# Patient Record
Sex: Male | Born: 1980 | Race: White | Hispanic: No | Marital: Single | Smoking: Current every day smoker
Health system: Southern US, Community
[De-identification: ages and names within clinical notes are randomized; demographics above are authoritative.]

---

## 2015-04-29 ENCOUNTER — Encounter (HOSPITAL_COMMUNITY): Payer: Self-pay | Admitting: Emergency Medicine

## 2015-04-29 ENCOUNTER — Emergency Department (HOSPITAL_COMMUNITY): Payer: Self-pay

## 2015-04-29 ENCOUNTER — Emergency Department (HOSPITAL_COMMUNITY)
Admission: EM | Admit: 2015-04-29 | Discharge: 2015-04-29 | Disposition: A | Discharge status: Home / Self Care | Payer: Self-pay | Attending: Emergency Medicine | Admitting: Emergency Medicine

## 2015-04-29 DIAGNOSIS — Z720 Tobacco use: Secondary | ICD-10-CM | POA: Insufficient documentation

## 2015-04-29 DIAGNOSIS — J069 Acute upper respiratory infection, unspecified: Secondary | ICD-10-CM | POA: Insufficient documentation

## 2015-04-29 DIAGNOSIS — L299 Pruritus, unspecified: Secondary | ICD-10-CM | POA: Insufficient documentation

## 2015-04-29 DIAGNOSIS — J309 Allergic rhinitis, unspecified: Secondary | ICD-10-CM | POA: Insufficient documentation

## 2015-04-29 MED ORDER — IPRATROPIUM BROMIDE 0.02 % IN SOLN
0.5000 mg | Freq: Once | RESPIRATORY_TRACT | Status: AC
  Administered 2015-04-29: 0.5 mg via RESPIRATORY_TRACT
  Filled 2015-04-29: qty 2.5

## 2015-04-29 MED ORDER — FLUTICASONE PROPIONATE 50 MCG/ACT NA SUSP: 1.0000 | Freq: Every day | NASAL | Status: AC

## 2015-04-29 MED ORDER — ALBUTEROL SULFATE (2.5 MG/3ML) 0.083% IN NEBU
5.0000 mg | INHALATION_SOLUTION | Freq: Once | RESPIRATORY_TRACT | Status: AC
  Administered 2015-04-29: 5 mg via RESPIRATORY_TRACT
  Filled 2015-04-29: qty 6

## 2015-04-29 MED ORDER — CETIRIZINE HCL 10 MG PO TABS: 10.0000 mg | ORAL_TABLET | Freq: Every day | ORAL | Status: AC

## 2015-04-29 MED ORDER — BENZONATATE 100 MG PO CAPS: 100.0000 mg | ORAL_CAPSULE | Freq: Three times a day (TID) | ORAL | Status: AC

## 2015-04-29 MED ORDER — NAPROXEN 250 MG PO TABS: 250.0000 mg | ORAL_TABLET | Freq: Two times a day (BID) | ORAL | Status: AC

## 2015-04-29 MED ORDER — ALBUTEROL SULFATE HFA 108 (90 BASE) MCG/ACT IN AERS
2.0000 | INHALATION_SPRAY | Freq: Once | RESPIRATORY_TRACT | Status: AC
  Administered 2015-04-29: 2 via RESPIRATORY_TRACT
  Filled 2015-04-29: qty 6.7

## 2015-04-29 NOTE — ED Notes (Signed)
Pt reports cough and congestion x 4 days , also reports body aches. Pt denies chest pain yet discomfort when coughing.

## 2015-04-29 NOTE — ED Provider Notes (Signed)
CSN: [id number]     Arrival date & time 04/29/15  1616 History  By signing my name below, I, Freida Busmaniana Omoyeni, attest that this documentation has been prepared under the direction and in the presence of non-physician practitioner, Everlene FarrierWilliam Sahasra Belue, PA-C. Electronically Signed: Freida Busmaniana Omoyeni, Scribe. 04/29/2015. 5:30 PM.  Chief Complaint  Patient presents with  . URI   The history is provided by the patient. No language interpreter was used.   HPI Comments:  Christian Schmidt is a 34 y.o. male who presents to the Emergency Department complaining of "deep" cough for ~ 4 days. He reports associated nasal congestion, myalgias, eye itching, wheezing, sneezing, runny nose, and postnasal drip. He has taken mucinex without relief. Pt denies fever, shortness of breath, chest pain, ear pain, abdominal pain, nausea, rash and vomiting. Pt smokes daily; denies h/o asthma, COPD, and emphysema. No alleviating factors noted.  History reviewed. No pertinent past medical history. History reviewed. No pertinent past surgical history. No family history on file. Social History  Substance Use Topics  . Smoking status: Current Every Day Smoker  . Smokeless tobacco: None  . Alcohol Use: Yes    Review of Systems  Constitutional: Negative for fever and chills.  HENT: Positive for congestion, postnasal drip, rhinorrhea and sneezing. Negative for ear pain, sore throat and trouble swallowing.   Eyes: Positive for itching. Negative for visual disturbance.  Respiratory: Positive for cough and wheezing. Negative for shortness of breath.   Cardiovascular: Negative for chest pain.  Gastrointestinal: Negative for nausea, vomiting and abdominal pain.  Genitourinary: Negative for dysuria.  Musculoskeletal: Negative for myalgias, arthralgias, neck pain and neck stiffness.  Skin: Negative for rash.  Neurological: Negative for light-headedness and headaches.   Allergies  Review of patient's allergies indicates not on  file.  Home Medications   Prior to Admission medications   Medication Sig Start Date End Date Taking? Authorizing Provider  benzonatate (TESSALON) 100 MG capsule Take 1 capsule (100 mg total) by mouth every 8 (eight) hours. 04/29/15   Everlene FarrierWilliam Jordell Outten, PA-C  cetirizine (ZYRTEC ALLERGY) 10 MG tablet Take 1 tablet (10 mg total) by mouth daily. 04/29/15   Everlene FarrierWilliam India Jolin, PA-C  fluticasone (FLONASE) 50 MCG/ACT nasal spray Place 1 spray into both nostrils daily. 04/29/15   Everlene FarrierWilliam Cydney Alvarenga, PA-C  naproxen (NAPROSYN) 250 MG tablet Take 1 tablet (250 mg total) by mouth 2 (two) times daily with a meal. 04/29/15   Everlene FarrierWilliam Cardarius Senat, PA-C   BP 130/85 mmHg  Pulse 86  Temp(Src) 98.1 F (36.7 C) (Oral)  Resp 14  SpO2 98% Physical Exam  Constitutional: He appears well-developed and well-nourished. No distress.  Nontoxic appearing.  HENT:  Head: Normocephalic and atraumatic.  Right Ear: Tympanic membrane and external ear normal.  Left Ear: External ear normal.  Mouth/Throat: Oropharynx is clear and moist. No oropharyngeal exudate.  Boggy nasal turbinates bilaterally No tonsillar hypertrophy or exudates.  Bilateral tympanic membranes are pearly-gray without erythema or loss of landmarks.   Eyes: Conjunctivae are normal. Pupils are equal, round, and reactive to light. Right eye exhibits no discharge. Left eye exhibits no discharge.  Neck: Normal range of motion. Neck supple.  Cardiovascular: Normal rate, regular rhythm, normal heart sounds and intact distal pulses.   Pulmonary/Chest: Effort normal. No respiratory distress. He has wheezes. He has no rales.  Scattered wheezes bilaterally. No increased work of breathing. Patient speaking in complete sentences. Symmetric chest expansion bilaterally.  Abdominal: Soft. There is no tenderness. There is no guarding.  Musculoskeletal: He  exhibits no edema or tenderness.  Lymphadenopathy:    He has no cervical adenopathy.  Neurological: He is alert. Coordination  normal.  Skin: Skin is warm and dry. No rash noted. He is not diaphoretic. No erythema. No pallor.  Psychiatric: He has a normal mood and affect. His behavior is normal.  Nursing note and vitals reviewed.   ED Course  Procedures   DIAGNOSTIC STUDIES:  Oxygen Saturation is 98% on RA, normal by my interpretation.    COORDINATION OF CARE:  5:25 PM Will order breathing treatment and CXR.  Discussed treatment plan with pt at bedside and pt agreed to plan.  Labs Review Labs Reviewed - No data to display  Imaging Review Dg Chest 2 View  04/29/2015  CLINICAL DATA:  Productive cough, wheezing. EXAM: CHEST  2 VIEW COMPARISON:  None. FINDINGS: The heart size and mediastinal contours are within normal limits. Both lungs are clear. No pneumothorax or pleural effusion is noted. Pectus excavatum deformity of the sternum is noted. IMPRESSION: No active cardiopulmonary disease. Electronically Signed   By: Lupita Raider, M.D.   On: 04/29/2015 17:42   I have personally reviewed and evaluated these images as part of my medical decision-making.   EKG Interpretation None      Filed Vitals:   04/29/15 1622  BP: 130/85  Pulse: 86  Temp: 98.1 F (36.7 C)  TempSrc: Oral  Resp: 14  SpO2: 98%     MDM   Meds given in ED:  Medications  albuterol (PROVENTIL) (2.5 MG/3ML) 0.083% nebulizer solution 5 mg (5 mg Nebulization Given 04/29/15 1802)  ipratropium (ATROVENT) nebulizer solution 0.5 mg (0.5 mg Nebulization Given 04/29/15 1802)  albuterol (PROVENTIL HFA;VENTOLIN HFA) 108 (90 BASE) MCG/ACT inhaler 2 puff (2 puffs Inhalation Given 04/29/15 1838)    New Prescriptions   BENZONATATE (TESSALON) 100 MG CAPSULE    Take 1 capsule (100 mg total) by mouth every 8 (eight) hours.   CETIRIZINE (ZYRTEC ALLERGY) 10 MG TABLET    Take 1 tablet (10 mg total) by mouth daily.   FLUTICASONE (FLONASE) 50 MCG/ACT NASAL SPRAY    Place 1 spray into both nostrils daily.   NAPROXEN (NAPROSYN) 250 MG TABLET    Take  1 tablet (250 mg total) by mouth 2 (two) times daily with a meal.    Final diagnoses:  URI (upper respiratory infection)  Allergic rhinitis, unspecified allergic rhinitis type   This  is a 34 y.o. male who presents to the Emergency Department complaining of "deep" cough for ~ 4 days. He reports associated nasal congestion, myalgias, eye itching, wheezing, sneezing, runny nose, and postnasal drip. He denies fever or shortness of breath. On exam the patient is afebrile and nontoxic-appearing. He has scattered wheezes noted bilaterally. He denies history of asthma. No increased work of breathing. Alert and saturation 98% there. Will provide with breathing treatment and check chest x-ray. Chest x-ray is unremarkable. Repeat lung exam after breathing treatment is much improved. Patient reports improvement after breathing treatment. Will discharge with albuterol inhaler, prescriptions for Zyrtec, Tessalon Perles, Flonase and Naprosyn. Patient has evidence of a viral URI. I advised the patient to follow-up with their primary care provider this week. I advised the patient to return to the emergency department with new or worsening symptoms or new concerns. The patient verbalized understanding and agreement with plan.    I personally performed the services described in this documentation, which was scribed in my presence. The recorded information has been reviewed and  is accurate.       Everlene Farrier, PA-C 04/29/15 1841  Bethann Berkshire, MD 05/03/15 [phone]

## 2015-04-29 NOTE — Discharge Instructions (Signed)
Upper Respiratory Infection, Adult Most upper respiratory infections (URIs) are a viral infection of the air passages leading to the lungs. A URI affects the nose, throat, and upper air passages. The most common type of URI is nasopharyngitis and is typically referred to as "the common cold." URIs run their course and usually go away on their own. Most of the time, a URI does not require medical attention, but sometimes a bacterial infection in the upper airways can follow a viral infection. This is called a secondary infection. Sinus and middle ear infections are common types of secondary upper respiratory infections. Bacterial pneumonia can also complicate a URI. A URI can worsen asthma and chronic obstructive pulmonary disease (COPD). Sometimes, these complications can require emergency medical care and may be life threatening.  CAUSES Almost all URIs are caused by viruses. A virus is a type of germ and can spread from one person to another.  RISKS FACTORS You may be at risk for a URI if:   You smoke.   You have chronic heart or lung disease.  You have a weakened defense (immune) system.   You are very young or very old.   You have nasal allergies or asthma.  You work in crowded or poorly ventilated areas.  You work in health care facilities or schools. SIGNS AND SYMPTOMS  Symptoms typically develop 2-3 days after you come in contact with a cold virus. Most viral URIs last 7-10 days. However, viral URIs from the influenza virus (flu virus) can last 14-18 days and are typically more severe. Symptoms may include:   Runny or stuffy (congested) nose.   Sneezing.   Cough.   Sore throat.   Headache.   Fatigue.   Fever.   Loss of appetite.   Pain in your forehead, behind your eyes, and over your cheekbones (sinus pain).  Muscle aches.  DIAGNOSIS  Your health care provider may diagnose a URI by:  Physical exam.  Tests to check that your symptoms are not due to  another condition such as:  Strep throat.  Sinusitis.  Pneumonia.  Asthma. TREATMENT  A URI goes away on its own with time. It cannot be cured with medicines, but medicines may be prescribed or recommended to relieve symptoms. Medicines may help:  Reduce your fever.  Reduce your cough.  Relieve nasal congestion. HOME CARE INSTRUCTIONS   Take medicines only as directed by your health care provider.   Gargle warm saltwater or take cough drops to comfort your throat as directed by your health care provider.  Use a warm mist humidifier or inhale steam from a shower to increase air moisture. This may make it easier to breathe.  Drink enough fluid to keep your urine clear or pale yellow.   Eat soups and other clear broths and maintain good nutrition.   Rest as needed.   Return to work when your temperature has returned to normal or as your health care provider advises. You may need to stay home longer to avoid infecting others. You can also use a face mask and careful hand washing to prevent spread of the virus.  Increase the usage of your inhaler if you have asthma.   Do not use any tobacco products, including cigarettes, chewing tobacco, or electronic cigarettes. If you need help quitting, ask your health care provider. PREVENTION  The best way to protect yourself from getting a cold is to practice good hygiene.   Avoid oral or hand contact with people with cold  symptoms.   Wash your hands often if contact occurs.  There is no clear evidence that vitamin C, vitamin E, echinacea, or exercise reduces the chance of developing a cold. However, it is always recommended to get plenty of rest, exercise, and practice good nutrition.  SEEK MEDICAL CARE IF:   You are getting worse rather than better.   Your symptoms are not controlled by medicine.   You have chills.  You have worsening shortness of breath.  You have brown or red mucus.  You have yellow or brown nasal  discharge.  You have pain in your face, especially when you bend forward.  You have a fever.  You have swollen neck glands.  You have pain while swallowing.  You have white areas in the back of your throat. SEEK IMMEDIATE MEDICAL CARE IF:   You have severe or persistent:  Headache.  Ear pain.  Sinus pain.  Chest pain.  You have chronic lung disease and any of the following:  Wheezing.  Prolonged cough.  Coughing up blood.  A change in your usual mucus.  You have a stiff neck.  You have changes in your:  Vision.  Hearing.  Thinking.  Mood. MAKE SURE YOU:   Understand these instructions.  Will watch your condition.  Will get help right away if you are not doing well or get worse.   This information is not intended to replace advice given to you by your health care provider. Make sure you discuss any questions you have with your health care provider.   Document Released: 12/13/2000 Document Revised: 11/03/2014 Document Reviewed: 09/24/2013 Elsevier Interactive Patient Education 2016 [employer].  Allergic Rhinitis Allergic rhinitis is when the mucous membranes in the nose respond to allergens. Allergens are particles in the air that cause your body to have an allergic reaction. This causes you to release allergic antibodies. Through a chain of events, these eventually cause you to release histamine into the blood stream. Although meant to protect the body, it is this release of histamine that causes your discomfort, such as frequent sneezing, congestion, and an itchy, runny nose.  CAUSES Seasonal allergic rhinitis (hay fever) is caused by pollen allergens that may come from grasses, trees, and weeds. Year-round allergic rhinitis (perennial allergic rhinitis) is caused by allergens such as house dust mites, pet dander, and mold spores. SYMPTOMS  Nasal stuffiness (congestion).  Itchy, runny nose with sneezing and tearing of the eyes. DIAGNOSIS Your  health care provider can help you determine the allergen or allergens that trigger your symptoms. If you and your health care provider are unable to determine the allergen, skin or blood testing may be used. Your health care provider will diagnose your condition after taking your health history and performing a physical exam. Your health care provider may assess you for other related conditions, such as asthma, pink eye, or an ear infection. TREATMENT Allergic rhinitis does not have a cure, but it can be controlled by:  Medicines that block allergy symptoms. These may include allergy shots, nasal sprays, and oral antihistamines.  Avoiding the allergen. Hay fever may often be treated with antihistamines in pill or nasal spray forms. Antihistamines block the effects of histamine. There are over-the-counter medicines that may help with nasal congestion and swelling around the eyes. Check with your health care provider before taking or giving this medicine. If avoiding the allergen or the medicine prescribed do not work, there are many new medicines your health care provider can prescribe. Stronger  medicine may be used if initial measures are ineffective. Desensitizing injections can be used if medicine and avoidance does not work. Desensitization is when a patient is given ongoing shots until the body becomes less sensitive to the allergen. Make sure you follow up with your health care provider if problems continue. HOME CARE INSTRUCTIONS It is not possible to completely avoid allergens, but you can reduce your symptoms by taking steps to limit your exposure to them. It helps to know exactly what you are allergic to so that you can avoid your specific triggers. SEEK MEDICAL CARE IF:  You have a fever.  You develop a cough that does not stop easily (persistent).  You have shortness of breath.  You start wheezing.  Symptoms interfere with normal daily activities.   This information is not intended  to replace advice given to you by your health care provider. Make sure you discuss any questions you have with your health care provider.   Document Released: 03/14/2001 Document Revised: 07/10/2014 Document Reviewed: 02/24/2013 Elsevier Interactive Patient Education 2016 [employer] Bronchospasm, Adult A bronchospasm is when the tubes that carry air in and out of your lungs (airways) spasm or tighten. During a bronchospasm it is hard to breathe. This is because the airways get smaller. A bronchospasm can be triggered by:  Allergies. These may be to animals, pollen, food, or mold.  Infection. This is a common cause of bronchospasm.  Exercise.  Irritants. These include pollution, cigarette smoke, strong odors, aerosol sprays, and paint fumes.  Weather changes.  Stress.  Being emotional. HOME CARE   Always have a plan for getting help. Know when to call your doctor and local emergency services (911 in the [place] Know where you can get emergency care.  Only take medicines as told by your doctor.  If you were prescribed an inhaler or nebulizer machine, ask your doctor how to use it correctly. Always use a spacer with your inhaler if you were given one.  Stay calm during an attack. Try to relax and breathe more slowly.  Control your home environment:  Change your heating and air conditioning filter at least once a month.  Limit your use of fireplaces and wood stoves.  Do not  smoke. Do not  allow smoking in your home.  Avoid perfumes and fragrances.  Get rid of pests (such as roaches and mice) and their droppings.  Throw away plants if you see mold on them.  Keep your house clean and dust free.  Replace carpet with wood, tile, or vinyl flooring. Carpet can trap dander and dust.  Use allergy-proof pillows, mattress covers, and box spring covers.  Wash bed sheets and blankets every week in hot water. Dry them in a dryer.  Use blankets that are made of polyester or  cotton.  Wash hands frequently. GET HELP IF:  You have muscle aches.  You have chest pain.  The thick spit you spit or cough up (sputum) changes from clear or white to yellow, green, gray, or bloody.  The thick spit you spit or cough up gets thicker.  There are problems that may be related to the medicine you are given such as:  A rash.  Itching.  Swelling.  Trouble breathing. GET HELP RIGHT AWAY IF:  You feel you cannot breathe or catch your breath.  You cannot stop coughing.  Your treatment is not helping you breathe better.  You have very bad chest pain. MAKE SURE YOU:   Understand these instructions.  Will watch your condition.  Will get help right away if you are not doing well or get worse.   This information is not intended to replace advice given to you by your health care provider. Make sure you discuss any questions you have with your health care provider.   Document Released: 04/16/2009 Document Revised: 07/10/2014 Document Reviewed: 12/10/2012 Elsevier Interactive Patient Education [employer]

## 2017-01-25 IMAGING — CR DG CHEST 2V
2 series · 2 of 2 positions shown · non-contrast
Comparison: None.

CLINICAL DATA: Productive cough, wheezing.

EXAM:
CHEST  2 VIEW

[w chest pa]
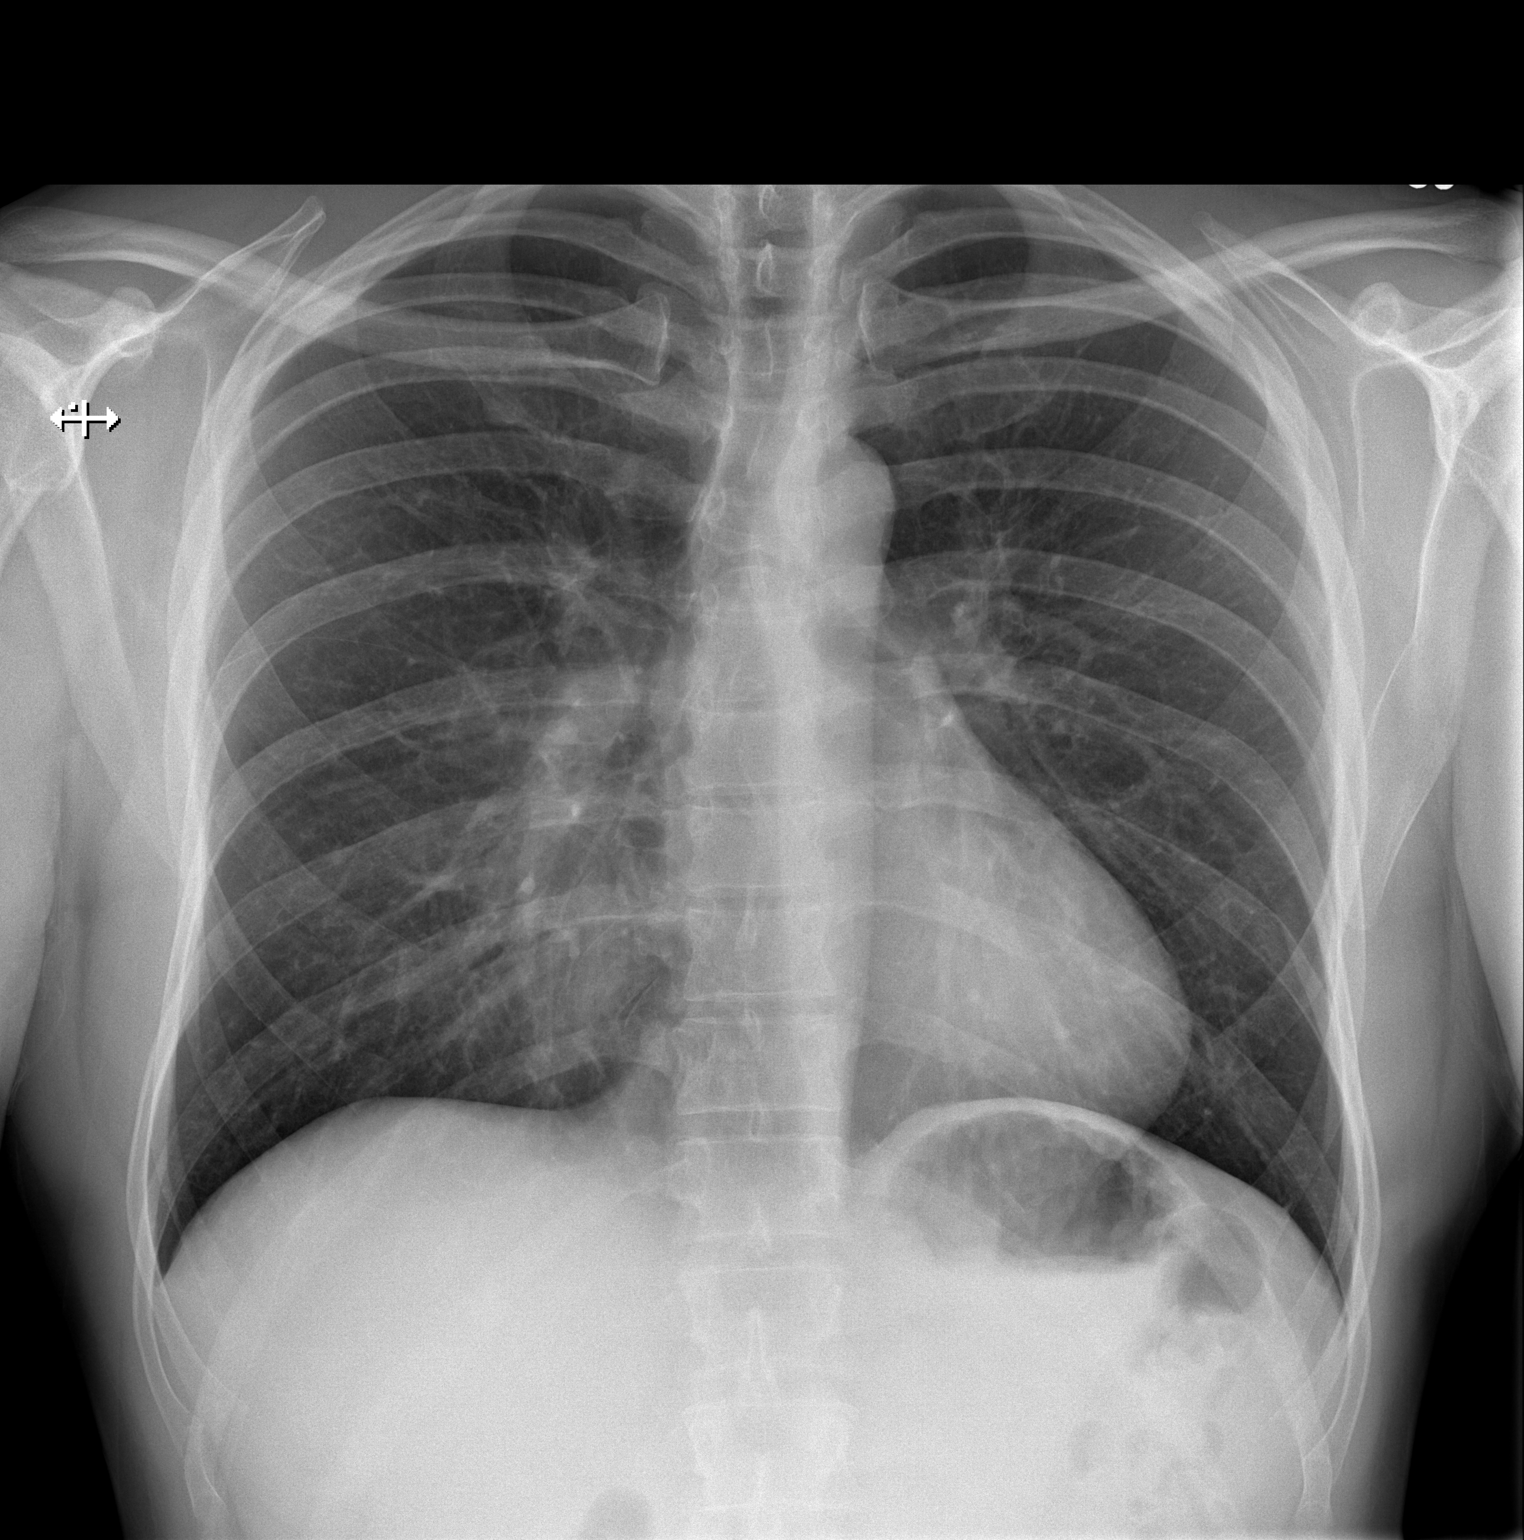

[w chest lat]
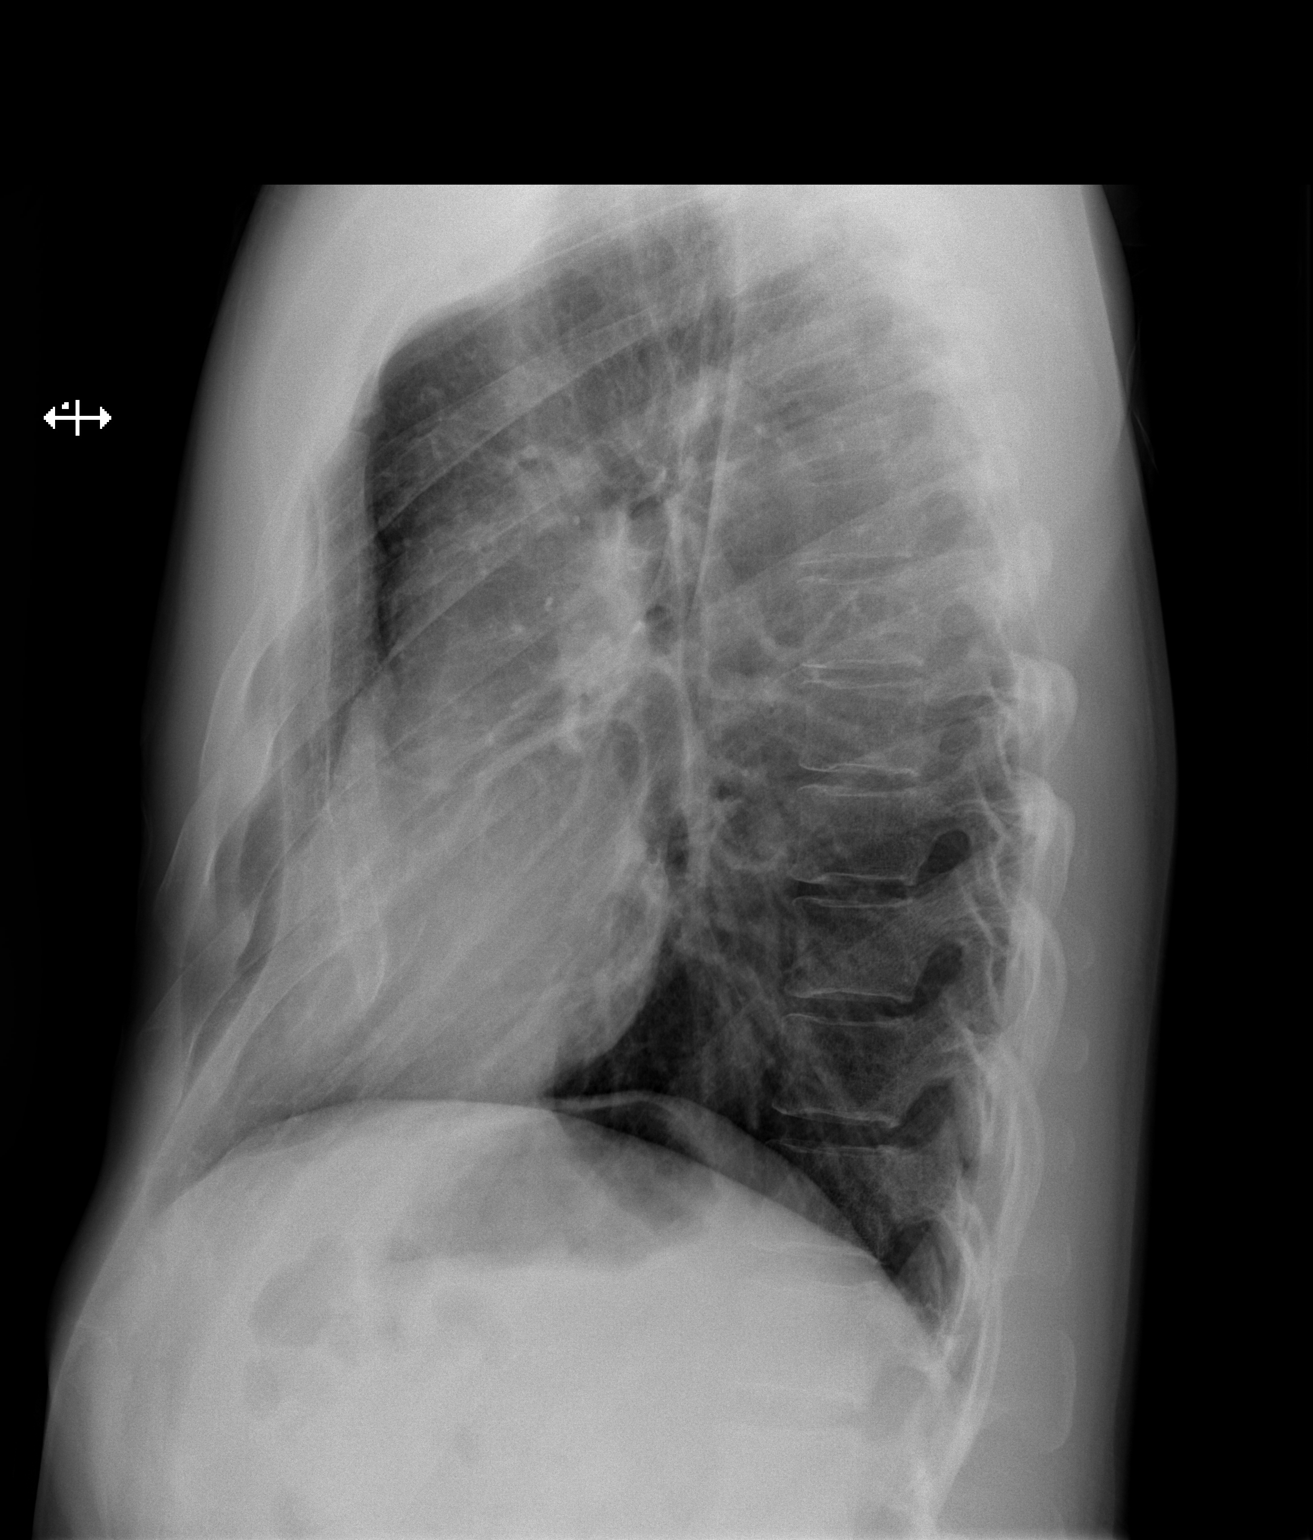

[2 of 2 positions shown; findings below may reference images not displayed]

FINDINGS: The heart size and mediastinal contours are within normal limits.
Both lungs are clear. No pneumothorax or pleural effusion is noted.
Pectus excavatum deformity of the sternum is noted.
IMPRESSION: No active cardiopulmonary disease.
